# Patient Record
Sex: Male | Born: 1970 | Race: White | Hispanic: No | Marital: Married | Smoking: Never smoker
Health system: Southern US, Community
[De-identification: ages and names within clinical notes are randomized; demographics above are authoritative.]

## PROBLEM LIST (undated history)

## (undated) HISTORY — PX: TONSILLECTOMY AND ADENOIDECTOMY: SHX28

---

## 2001-07-05 ENCOUNTER — Encounter: Admission: RE | Admit: 2001-07-05 | Discharge: 2001-07-05 | Payer: Self-pay | Admitting: Sports Medicine

## 2001-09-01 ENCOUNTER — Encounter: Admission: RE | Admit: 2001-09-01 | Discharge: 2001-09-01 | Payer: Self-pay | Admitting: Sports Medicine

## 2001-12-08 ENCOUNTER — Encounter: Admission: RE | Admit: 2001-12-08 | Discharge: 2001-12-08 | Payer: Self-pay | Admitting: Sports Medicine

## 2001-12-08 ENCOUNTER — Encounter: Payer: Self-pay | Admitting: Family Medicine

## 2001-12-08 ENCOUNTER — Inpatient Hospital Stay (HOSPITAL_COMMUNITY): Admission: AD | Admit: 2001-12-08 | Discharge: 2001-12-11 | Payer: Self-pay | Admitting: Family Medicine

## 2001-12-13 ENCOUNTER — Encounter (HOSPITAL_BASED_OUTPATIENT_CLINIC_OR_DEPARTMENT_OTHER): Admission: RE | Admit: 2001-12-13 | Discharge: 2002-03-13 | Payer: Self-pay | Admitting: Family Medicine

## 2001-12-19 ENCOUNTER — Encounter: Admission: RE | Admit: 2001-12-19 | Discharge: 2001-12-19 | Payer: Self-pay | Admitting: Sports Medicine

## 2001-12-27 ENCOUNTER — Encounter: Admission: RE | Admit: 2001-12-27 | Discharge: 2001-12-27 | Payer: Self-pay | Admitting: Sports Medicine

## 2008-07-19 ENCOUNTER — Ambulatory Visit: Payer: Self-pay | Admitting: Sports Medicine

## 2008-07-19 DIAGNOSIS — J019 Acute sinusitis, unspecified: Secondary | ICD-10-CM | POA: Insufficient documentation

## 2008-07-19 DIAGNOSIS — R030 Elevated blood-pressure reading, without diagnosis of hypertension: Secondary | ICD-10-CM | POA: Insufficient documentation

## 2008-07-26 ENCOUNTER — Encounter: Payer: Self-pay | Admitting: Sports Medicine

## 2008-07-26 LAB — CONVERTED CEMR LAB
Cholesterol: 162 mg/dL (ref 0–200)
Glucose, Bld: 81 mg/dL (ref 70–99)
HDL: 40 mg/dL (ref >39)
LDL Cholesterol: 96 mg/dL (ref 0–99)
Total CHOL/HDL Ratio: 4.1
Triglycerides: 128 mg/dL (ref <150)
VLDL: 26 mg/dL (ref 0–40)

## 2008-08-01 ENCOUNTER — Encounter (INDEPENDENT_AMBULATORY_CARE_PROVIDER_SITE_OTHER): Payer: Self-pay | Admitting: *Deleted

## 2008-08-01 ENCOUNTER — Encounter: Payer: Self-pay | Admitting: Sports Medicine

## 2008-08-14 ENCOUNTER — Ambulatory Visit: Payer: Self-pay | Admitting: Sports Medicine

## 2008-08-14 DIAGNOSIS — J301 Allergic rhinitis due to pollen: Secondary | ICD-10-CM | POA: Insufficient documentation

## 2010-06-20 NOTE — H&P (Signed)
Philip Waters, Philip Waters                             ACCOUNT NO.:  [id number]   MEDICAL RECORD NO.:  [id number]                   PATIENT TYPE:  INP   LOCATION:  5705                                 FACILITY:  [facility]   PHYSICIAN:  Dalbert Mayotte, M.D.                 DATE OF BIRTH:  06-Jan-1971   DATE OF ADMISSION:  12/08/2001  DATE OF DISCHARGE:                                HISTORY & PHYSICAL   ADMISSION DIAGNOSIS:  Left foot cellulitis.   HISTORY OF PRESENT ILLNESS:  The patient is a 40 year old Caucasian  male  who presented with a painful infected left toe. He reported a two year  history of having  an ingrown toenail for which he has treated himself by  cutting out the ingrown toenail with toenail clippers. He said that back on  November 25, 2001, he tried to cut it out a little more aggressively and then  it started to get red on Saturday, December 03, 2001. It became progressively  worse, turning red and painful and swollen over Sunday and Monday. On  Tuesday he tried to go to work and was sent to the city clinic nurse, who  then [facility], given the  severity of  the wound. There he was  evaluated and placed on dicloxacillin and p.o.  Vicodin with Bacitracin ointment to the toe. Since then though, he said the  toe did not appear to be getting any better and he was  having  some  drainage from the toe, so he came in to see Dr. Darrick Penna at the [facility] for further evaluation.   REVIEW OF SYSTEMS:  He denied any fever or chills. He did report some flu  symptoms approximately three or four weeks ago but otherwise has felt well.  He did have purulent drainage from the toe. Otherwise the review of systems  was negative.   PAST MEDICAL HISTORY:  Basically not significant. He did have a history of  atopic dermatitis on the bottom of his foot which he has been treated, and  borderline hyperlipidemia. He is status post  adenoidectomy as a teen.   CURRENT MEDICATIONS:  None.   ALLERGIES:  None.   SOCIAL HISTORY:  He lives with his wife  and 89-month-old son in [place].  He works as a Futures trader. He is a nonsmoker.   FAMILY HISTORY:  His father has coronary artery disease and  is status post  CABG, but there is no history of diabetes, hypertension or cancer in the  family.   PHYSICAL EXAMINATION:  VITAL SIGNS:  Blood pressure 128/80, temperature  97.0, pulse 62m, respiratory rate 16.  GENERAL:  He is a well appearing Caucasian  male in no acute distress. He is  slightly obese.  HEENT:  Pupils equally round and reactive to  light and accomodation. TMs  clear bilaterally. Oropharynx is clear with moist mucous membranes.  NECK:  Supple without lymphadenopathy or thyromegaly.  HEART:  Regular rate and rhythm, no murmur or gallop.  CHEST:  Clear to auscultation and percussion bilaterally.  ABDOMEN:  Soft, slightly obese, nontender, nondistended.  EXTREMITIES:  His right extremity shows no cyanosis, clubbing or edema. The  left extremity is significant for the edema of his left foot, especially  over the dorsal aspect and extending into his left great toe.  SKIN:  He has a large area of erythema over the dorsal aspect of his left  great toe extending onto the mid foot dorsum of his foot. There is a large  weeping area approximately 2 x 1 cm in length at the base right over his  metatarsal phalangeal joint on the left foot. This is draining  serosanguinous fluid.   LABORATORY DATA:  A wound culture was obtained of this area. The bone was  unable to be probed secondary to the patient's pain. A CBC, BMP, sed rate  and blood culture are pending.   ASSESSMENT AND PLAN:  This is a 40 year old white male with no significant  past medical history, admitted  with cellulitis of his left foot.   Cellulitis. Clearly  he has also developed some form of abscess on the  dorsal aspect of the base of  his left great toe that is spontaneously  draining at the base of his toe. This is worrisome for possibly underlying  osteo in  his first MTP joint. We did obtain wound cultures. We will follow  up on the results of that. We will check a sed rate, CBC with differential,  blood cultures x 2 and start him on IV antibiotics. Given the possibility of  underlying abscess formation, we will cover for  both Staph and anaerobes  with IV Unasyn. We can narrow this once we get back culture results. We will  go ahead and obtain plain films of the left great toe to look for any signs  of underlying osteo and consider proceeding with a bone scan if this is  negative and our suspicion remains high.                                               Dalbert Mayotte, M.D.    AS/MEDQ  D:  12/08/2001  T:  12/08/2001  Job:  [id number]

## 2010-06-20 NOTE — Discharge Summary (Signed)
   NAMELAURIS, SERVISS                             ACCOUNT NO.:  [id number]   MEDICAL RECORD NO.:  [id number]                   PATIENT TYPE:  INP   LOCATION:  5705                                 FACILITY:  [facility]   PHYSICIAN:  Wayne A. Sheffield Slider, M.D.                 DATE OF BIRTH:  [birth year]   DATE OF ADMISSION:  12/08/2001  DATE OF DISCHARGE:  12/11/2001                                 DISCHARGE SUMMARY   DISCHARGE DIAGNOSES:  Left great toe cellulitis.   DISCHARGE MEDICATIONS:  1. Augmentin 875 mg b.i.d. for 12 days.  2. Cipro 500 mg b.i.d. for five days.  3. Vicodin one tablet q.6h. p.r.n. pain.   DISPOSITION AND FOLLOWUP:  The patient was discharged to home and scheduled  for outpatient hydrotherapy on his left great toe as well as instructed to  use his  orthopedic boot every time he puts weight on his foot. The patient  was instructed to call his primary care physician to set up a followup  appointment in two weeks.   HOSPITAL COURSE:  This 40 year old white male was admitted for a left great  toe cellulitis. An x-ray of his foot was obtained  which showed some  swelling but no osteomyelitis. The patient was given Vicodin for the pain  which was controlled throughout hospitalization as well as patient was  afebrile the entire hospital stay. On day of discharge, the patient had  completed four days of Unasyn IV as well as two days of Cipro by mouth. He  is discharged on Augmentin as well as Cipro to fully treat the infection. He  will also return to the [facility] foot clinic for hydrotherapy treatment  and was instructed on wound care for his toe. The patient will follow up  with his primary care physician.   LABORATORY DATA:  White blood count on admission was 12.6, white count on  discharge was 10.7, hemoglobin 13.8, hematocrit 41.9, platelets 362. Wound  culture showed few WBCs, predominantly PMNs but no organisms, and blood  cultures showed gram negative rods in one tube  which was the preliminary and  blood culture tube were negative to date.     Billey Gosling, M.D.                       Wayne A. Sheffield Slider, M.D.    AS/MEDQ  D:  12/29/2001  T:  12/30/2001  Job:  [id number]

## 2012-04-19 ENCOUNTER — Encounter: Payer: Self-pay | Admitting: *Deleted

## 2012-04-19 DIAGNOSIS — I10 Essential (primary) hypertension: Secondary | ICD-10-CM

## 2012-06-17 ENCOUNTER — Encounter: Payer: Self-pay | Admitting: Sports Medicine

## 2012-06-17 ENCOUNTER — Encounter (HOSPITAL_COMMUNITY): Payer: Self-pay

## 2016-10-20 ENCOUNTER — Other Ambulatory Visit: Payer: Self-pay | Admitting: Sports Medicine

## 2016-10-20 ENCOUNTER — Ambulatory Visit (INDEPENDENT_AMBULATORY_CARE_PROVIDER_SITE_OTHER): Payer: Managed Care, Other (non HMO) | Admitting: Family Medicine

## 2016-10-20 VITALS — BP 124/80 | Ht 70.0 in | Wt 270.0 lb

## 2016-10-20 DIAGNOSIS — M5410 Radiculopathy, site unspecified: Secondary | ICD-10-CM | POA: Diagnosis not present

## 2016-10-20 MED ORDER — GABAPENTIN 100 MG PO CAPS: ORAL_CAPSULE | ORAL | 2 refills | Status: DC

## 2016-10-20 NOTE — Patient Instructions (Signed)
Take by tablet by mouth Day 1-3:   take one at night  Day 4-6:  Take 2 at night Day 7 until I see you back  take 3 at night Great to see you!

## 2016-10-21 ENCOUNTER — Encounter: Payer: Self-pay | Admitting: Family Medicine

## 2016-10-21 NOTE — Progress Notes (Signed)
    CHIEF COMPLAINT / HPI:  Left low back pain radiating into his le all the way down to the foot.  This is been going on chronically fo  No specific inciting event.  Gradual onset and symptoms have been stable for the last  6-8 months.  He has some aching cramping and sensation but has had no falls or giving way of the left leg.  He gets some tingling and numbness occasionally but never totally loses sensation.  Pain is worse if he does a lot of heavy lifting.  Some days he has very little pain  REVIEW OF SYSTEMS:  No bowel or bladder incontinence.  He has had no unusual weight change.  No fever.  See HPI for additional pertinent review of systems  PERTINENT  PMH / PSH: I have reviewed the patient's medications, allergies, past medical and surgical history, smoking status and updated in the EMR as appropriate. History of elevated blood pressure with out diagnosis of hypertension Obesity Never smoker  OBJECTIVE: GENERAL: Well-developed, overweight male, no acute distress ABDOMEN: Soft, positive bowel sounds. BACK: Mild muscle spasm is noted in the mid thoracic paraspinal muscles on the left.  There is no defect noted.  He has some limited flexion at the hips but this is mostly related to hamstring tight.  He has normal hyperextension and ro  Straight leg raise is negative bilaterally  MSK normal muscle bulk and tone diffusely.  His flexion/extension strength is normal at hips and knee, dorsiflexion/plantarflexion normal and symmetrical as well. NEURO: DTRs 1-2+ bilaterally equal  At the knee and ankle  PSYCHIATRIC: Alert and oriented 4  ASSESSMENT / PLAN: #1.  Chronic low back pain, intermittently problematic with some radicular symptoms but no weakness.  We discussed options including further evaluation with imaging including MRI versus more conservative treatment options such as medication like gabapentin.  Ultimately he decided to try the gabapentin and follow-up with me in 4-6 weeks.  He  will call in the interim if he has any new or worsening symptoms.

## 2016-11-20 ENCOUNTER — Other Ambulatory Visit: Payer: Managed Care, Other (non HMO) | Admitting: Family Medicine

## 2017-05-14 ENCOUNTER — Ambulatory Visit (INDEPENDENT_AMBULATORY_CARE_PROVIDER_SITE_OTHER): Payer: Managed Care, Other (non HMO) | Admitting: Family Medicine

## 2017-05-14 ENCOUNTER — Encounter: Payer: Self-pay | Admitting: Family Medicine

## 2017-05-14 VITALS — BP 114/70 | Ht 70.0 in | Wt 238.0 lb

## 2017-05-14 DIAGNOSIS — R002 Palpitations: Secondary | ICD-10-CM | POA: Insufficient documentation

## 2017-05-14 DIAGNOSIS — R0789 Other chest pain: Secondary | ICD-10-CM

## 2017-05-14 NOTE — Assessment & Plan Note (Signed)
Long conversation with him.  I feel very certain that these are palpitations and are not pathologic.  He is quite concerned about it and does have family history.  At one point he was told he should have an exercise treadmill test when he was in his 21s which she is not done.  I will set him up with cardiology referral to address these issues.

## 2017-05-14 NOTE — Progress Notes (Signed)
  Philip Waters - 47 y.o. male MRN [id number]  Date of birth: May 31, 1970    SUBJECTIVE:      Chief Complaint:/ HPI:  Heart skipping beats.  Has noticed this mostly in the evening when he is watching television.  Does not occur when he is up and moving around as often.  No chest pain.  The irregular beats make him quite anxious that something is wrong with his heart.  His father had significant cardiovascular history.  No unusual weight change although he has been on a plant-based diet for the last 8-9 months and has lost 36 pounds, it has been a gradual and expected loss.  No lower extremity edema.  No shortness of breath with exertion.   ROS:     See HPI  PERTINENT  PMH / PSH FH / / SH:  Past Medical, Surgical, Social, and Family History Reviewed & Updated in the EMR.  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  OBJECTIVE: BP 114/70   Ht 5\' 10"  (1.778 m)   Wt 238 lb (108 kg)   BMI 34.15 kg/m   Physical Exam:  Vital signs are reviewed. Vital signs reviewed. GENERAL: Well-developed, well-nourished, no acute distress. CARDIOVASCULAR: Regular rate and rhythm no murmur gallop or rub LUNGS: Clear to auscultation bilaterally, no rales or wheeze. ABDOMEN: Soft positive bowel sounds NEURO: No gross focal neurological deficits. MSK: Movement of extremity x 4.  EKG: Normal sinus rhythm.  Interventricular conduction delay/incomplete right bundle branch block.  Normal intervals.  Reviewed his EKG from last year which he brings with him.  No changes.  He also brings in some outside lab work which reveals total cholesterol of 166, LDL 108 and HDL 35.  Triglycerides 160.  CMP and CBC normal.  Normal PSA ASSESSMENT & PLAN:  See problem based charting & AVS for pt instructions.

## 2017-05-14 NOTE — Patient Instructions (Signed)
Dr Peter Swaziland Wednesday May 22nd at 840a [address] [place], [place] [address] [phone]

## 2017-05-18 ENCOUNTER — Encounter: Payer: Self-pay | Admitting: Family Medicine

## 2017-05-18 ENCOUNTER — Encounter: Payer: Self-pay | Admitting: Adult Health

## 2017-05-18 ENCOUNTER — Ambulatory Visit: Payer: Managed Care, Other (non HMO) | Admitting: Adult Health

## 2017-05-18 VITALS — BP 120/86 | HR 88 | Ht 70.0 in | Wt 234.0 lb

## 2017-05-18 DIAGNOSIS — R0789 Other chest pain: Secondary | ICD-10-CM | POA: Diagnosis not present

## 2017-05-18 DIAGNOSIS — R002 Palpitations: Secondary | ICD-10-CM | POA: Diagnosis not present

## 2017-05-18 DIAGNOSIS — Z79899 Other long term (current) drug therapy: Secondary | ICD-10-CM | POA: Diagnosis not present

## 2017-05-18 NOTE — Progress Notes (Signed)
Cardiology Office Note   Date:  05/18/2017   ID:  Philip Waters, DOB 04-Jan-1971, MRN [id number]  PCP:  Nestor Ramp, MD  Cardiologist: New Chief Complaint  Patient presents with  . Chest Pain     History of Present Illness: Philip Waters is a 47 y.o. male who presents for cardiac evaluation at the request of Dr. Denny Levy, for patient complaints of chest discomfort and palpitations.  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  He has no prior cardiac history.  His father, Aria Pickrell, is a patient of Dr. Elvis Coil as well, who is followed for coronary artery disease with history of CABG.  The patient states that he has been having multiple palpitations throughout the day, feeling his heart skipping, sometimes racing, and it is begun to worry him.  He states he has no associated shortness of breath dizziness diaphoresis or near syncope.  He has had no changes in his exercise capacity or stamina.  Adenoidectomy   No past medical history on file.  Past Surgical History:  Procedure Laterality Date  . TONSILLECTOMY AND ADENOIDECTOMY       No current outpatient medications on file.   No current facility-administered medications for this visit.     Allergies:   Patient has no known allergies.    Social History:  The patient  reports that he has never smoked. He has never used smokeless tobacco.   Family History:  The patient's family history includes Healthy in his mother; Heart disease in his father, paternal grandfather, and paternal grandmother; Hernia in his brother; Hyperlipidemia in his father.    ROS: All other systems are reviewed and negative. Unless otherwise mentioned in H&P    PHYSICAL EXAM: VS:  BP 120/86 (BP Location: Right Arm)   Pulse 88   Ht 5\' 10"  (1.778 m)   Wt 234 lb (106.1 kg)   BMI 33.58 kg/m  , BMI  Body mass index is 33.58 kg/m. GEN: Well nourished, well developed, in no acute distress  HEENT: normal  Neck: no JVD, carotid bruits, or masses Cardiac: RRR; occasional extrasystole no murmurs, rubs, or gallops,no edema  Respiratory:  Clear to auscultation bilaterally, normal work of breathing GI: soft, nontender, nondistended, + BS MS: no deformity or atrophy  Skin: warm and dry, no rash Neuro:  Strength and sensation are intact Psych: euthymic mood, full affect   EKG: Normal sinus rhythm with occasional PVCs no acute ST-T wave abnormalities.  Recent Labs: No results found for requested labs within last 8760 hours.    Lipid Panel    Component Value Date/Time   CHOL 162 07/26/2008 0017   TRIG 128 07/26/2008 0017   HDL 40 07/26/2008 0017   CHOLHDL 4.1 Ratio 07/26/2008 0017   VLDL 26 07/26/2008 0017   LDLCALC 96 07/26/2008 0017      Wt Readings from Last 3 Encounters:  05/18/17 234 lb (106.1 kg)  05/14/17 238 lb (108 kg)  10/20/16 270 lb (122.5 kg)      Other studies Reviewed: No prior cardiac studies to review  ASSESSMENT AND PLAN:  1.  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  He is currently asymptomatic with them but it does cause some anxiety for him due to the frequency.  He states that he has slight chest pressure associated but he thinks is from worrying about them.  My plan is to put a 2-week cardiac monitor on him for evaluation of morphology and frequency of his palpitations.  EKG did reveal 2 PVCs.  I am also going to check an echocardiogram for structural heart disease.  I am going to check some labs to evaluate potassium and magnesium status, along with a TSH.  Due to family history I am going to do a GXT to evaluate patient's heart rate response to exercise along with blood pressure and for changes in EKG during exercise.   I have discussed this with the patient and with Dr. Swaziland on site.  Dr.  Swaziland has reviewed his chart and EKG and is in agreement with my assessment and plan.  2.  Obesity: He has on a strict plant-based diet, has lost 39 pounds since December 2018.  He is doing well concerning this and is feeling better each day with weight loss..  Current medicines are reviewed at length with the patient today.    Labs/ tests ordered today include: Echocardiogram, GXT, BMET, TSH.   Bettey Mare. Liborio Nixon, ANP, AACC   05/18/2017 8:31 AM    [facility] [address] [place], [place] [address] Phone: [phone]; Fax: [phone]

## 2017-05-18 NOTE — Patient Instructions (Signed)
Medication Instructions:   NO CHANGES If you need a refill on your cardiac medications before your next appointment, please call your pharmacy.  Labwork: BMET,LIPID,LFT,TSH,MAG TODAY HERE IN OUR OFFICE AT [facility]  Take the provided lab slips for you to take with you to the lab for you blood draw.   Testing/Procedures: Your physician has requested that you have an exercise tolerance test .A cardiac stress test is a cardiological test that measures the heart's ability to respond to external stress(exercise-treadmill) in a controlled clinical environment.  For further information please visit [email or web address] Please also follow instruction sheet, as given.  Your physician has recommended that you wear an event monitor. This will be performed at our [facility] location - [address].  Event monitors are medical devices that record the heart's electrical activity. Doctors most often [place] these monitors to diagnose arrhythmias. Arrhythmias are problems with the speed or rhythm of the heartbeat. The monitor is a small, portable device. You can wear one while you do your normal daily activities. This is usually used to diagnose what is causing palpitations/syncope (passing out).  Echocardiogram - Your physician has requested that you have an echocardiogram. Echocardiography is a painless test that uses sound waves to create images of your heart. It provides your doctor with information about the size and shape of your heart and how well your heart's chambers and valves are working. This procedure takes approximately one hour. There are no restrictions for this procedure. This will be performed at our [facility] location - [address].  Follow-Up: Your physician wants you to follow-up in: WITH JR Swaziland AFTER TESTING-ABOUT 3-4 WEEKS   Thank you for choosing [facility]

## 2017-05-19 LAB — LIPID PANEL
Chol/HDL Ratio: 4.4 ratio (ref 0.0–5.0)
Cholesterol, Total: 146 mg/dL (ref 100–199)
HDL: 33 mg/dL — ABNORMAL LOW (ref >39)
LDL Calculated: 91 mg/dL (ref 0–99)
Triglycerides: 110 mg/dL (ref 0–149)
VLDL Cholesterol Cal: 22 mg/dL (ref 5–40)

## 2017-05-19 LAB — HEPATIC FUNCTION PANEL
ALT: 38 IU/L (ref 0–44)
AST: 23 IU/L (ref 0–40)
Albumin: 4.9 g/dL (ref 3.5–5.5)
Alkaline Phosphatase: 109 IU/L (ref 39–117)
Bilirubin Total: 1.1 mg/dL (ref 0.0–1.2)
Bilirubin, Direct: 0.27 mg/dL (ref 0.00–0.40)
Total Protein: 7.7 g/dL (ref 6.0–8.5)

## 2017-05-19 LAB — BASIC METABOLIC PANEL
BUN/Creatinine Ratio: 15 (ref 9–20)
BUN: 13 mg/dL (ref 6–24)
CALCIUM: 10.1 mg/dL (ref 8.7–10.2)
CHLORIDE: 101 mmol/L (ref 96–106)
CO2: 22 mmol/L (ref 20–29)
Creatinine, Ser: 0.84 mg/dL (ref 0.76–1.27)
GFR, EST AFRICAN AMERICAN: 121 mL/min/{1.73_m2} (ref >59)
GFR, EST NON AFRICAN AMERICAN: 105 mL/min/{1.73_m2} (ref >59)
Glucose: 94 mg/dL (ref 65–99)
POTASSIUM: 4.2 mmol/L (ref 3.5–5.2)
SODIUM: 141 mmol/L (ref 134–144)

## 2017-05-19 LAB — TSH: TSH: 1.45 u[IU]/mL (ref 0.450–4.500)

## 2017-05-19 LAB — MAGNESIUM: MAGNESIUM: 2.3 mg/dL (ref 1.6–2.3)

## 2017-05-20 ENCOUNTER — Telehealth: Payer: Self-pay | Admitting: Cardiology

## 2017-05-20 ENCOUNTER — Telehealth (HOSPITAL_COMMUNITY): Payer: Self-pay

## 2017-05-20 NOTE — Telephone Encounter (Signed)
Encounter complete. 

## 2017-05-20 NOTE — Addendum Note (Signed)
Addended by: Armen Pickup T on: 05/20/2017 03:00 PM   Modules accepted: Orders

## 2017-05-20 NOTE — Telephone Encounter (Signed)
Spoke with pt, aware of lab results. Copy mailed to patient at his request. 

## 2017-05-20 NOTE — Telephone Encounter (Signed)
New Message ° ° °Patient is returning call in reference to labs. Please call to discuss.  °

## 2017-05-21 NOTE — Addendum Note (Signed)
Addended by: Armen Pickup T on: 05/21/2017 07:09 AM   Modules accepted: Orders

## 2017-05-25 ENCOUNTER — Ambulatory Visit (HOSPITAL_COMMUNITY)
Admission: RE | Admit: 2017-05-25 | Discharge: 2017-05-25 | Disposition: A | Discharge status: Home / Self Care | Payer: Managed Care, Other (non HMO) | Source: Ambulatory Visit | Attending: Cardiology | Admitting: Cardiology

## 2017-05-25 DIAGNOSIS — R0789 Other chest pain: Secondary | ICD-10-CM

## 2017-05-25 DIAGNOSIS — R002 Palpitations: Secondary | ICD-10-CM | POA: Diagnosis not present

## 2017-05-25 LAB — EXERCISE TOLERANCE TEST
Estimated workload: 13.7 METS
Exercise duration (min): 12 min
Exercise duration (sec): 0 s
MPHR: 174 {beats}/min
Peak HR: 190 {beats}/min
Percent HR: 109 %
RPE: 18
Rest HR: 85 {beats}/min

## 2017-06-02 ENCOUNTER — Encounter: Payer: Self-pay | Admitting: Cardiology

## 2017-06-02 ENCOUNTER — Ambulatory Visit (INDEPENDENT_AMBULATORY_CARE_PROVIDER_SITE_OTHER): Payer: Managed Care, Other (non HMO)

## 2017-06-02 ENCOUNTER — Other Ambulatory Visit: Payer: Self-pay

## 2017-06-02 ENCOUNTER — Ambulatory Visit (HOSPITAL_COMMUNITY): Payer: Managed Care, Other (non HMO) | Attending: Cardiology

## 2017-06-02 DIAGNOSIS — R0789 Other chest pain: Secondary | ICD-10-CM | POA: Insufficient documentation

## 2017-06-02 DIAGNOSIS — R002 Palpitations: Secondary | ICD-10-CM

## 2017-06-03 ENCOUNTER — Encounter: Payer: Self-pay | Admitting: Cardiology

## 2017-06-21 NOTE — Progress Notes (Signed)
Cardiology Office Note   Date:  06/23/2017   ID:  Philip Waters, DOB [birth year], MRN [id number]  PCP:  Philip Ramp, MD  Cardiologist: Kealohilani Maiorino Sw[place] MD Chief Complaint  Patient presents with  . Palpitations     History of Present Illness: Philip Waters is a 47 y.o. male who is seen for follow up  of  palpitations.  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  He has no prior cardiac history.  His father, Philip Waters, is a patient of mine as well, who is followed for coronary artery disease with history of CABG.  The patient states that beginning in March he has been having multiple palpitations throughout the day, feeling his heart skipping.  He states he has no associated shortness of breath, dizziness, diaphoresis or near syncope. No chest pain.  He has had no changes in his exercise capacity or stamina.  Admits he does not exercise much.   History reviewed. No pertinent past medical history.  Past Surgical History:  Procedure Laterality Date  . TONSILLECTOMY AND ADENOIDECTOMY       Current Outpatient Medications  Medication Sig Dispense Refill  . metoprolol succinate (TOPROL-XL) 50 MG 24 hr tablet Take 1 tablet (50 mg total) by mouth daily. Take with or immediately following a meal. 90 tablet 3   No current facility-administered medications for this visit.     Allergies:   Patient has no known allergies.    Social History:  The patient  reports that he has never smoked. He has never used smokeless tobacco. He reports that he does not drink alcohol or use drugs.   Family History:  The patient's family history includes Healthy in his mother; Heart disease in his father, paternal grandfather, and paternal grandmother; Hernia in his brother; Hyperlipidemia in his father.    ROS: All other systems are reviewed and negative.  Unless otherwise mentioned in H&P    PHYSICAL EXAM: VS:  BP 128/83   Pulse 83   Ht 5\' 10"  (1.778 m)   Wt 228 lb 9.6 oz (103.7 kg)   SpO2 99%   BMI 32.80 kg/m  , BMI Body mass index is 32.8 kg/m. GENERAL:  Well appearing HEENT:  PERRL, EOMI, sclera are clear. Oropharynx is clear. NECK:  No jugular venous distention, carotid upstroke brisk and symmetric, no bruits, no thyromegaly or adenopathy LUNGS:  Clear to auscultation bilaterally CHEST:  Unremarkable HEART:  RRR,  PMI not displaced or sustained,S1 and S2 within normal limits, no S3, no S4: no clicks, no rubs, no murmurs ABD:  Soft, nontender. BS +, no masses or bruits. No hepatomegaly, no splenomegaly EXT:  2 + pulses throughout, no edema, no cyanosis no clubbing SKIN:  Warm and dry.  No rashes NEURO:  Alert and oriented x 3. Cranial nerves II through XII intact. PSYCH:  Cognitively intact     EKG: Not done today.  Recent Labs: 05/18/2017: ALT 38; BUN 13; Creatinine, Ser 0.84; Magnesium 2.3; Potassium 4.2; Sodium 141; TSH 1.450    Lipid Panel    Component Value Date/Time   CHOL 146 05/18/2017 0857   TRIG 110 05/18/2017 0857   HDL 33 (L) 05/18/2017 0857   CHOLHDL 4.4 05/18/2017 0857   CHOLHDL 4.1 Ratio 07/26/2008 0017   VLDL 26 07/26/2008 0017  LDLCALC 91 05/18/2017 0857      Wt Readings from Last 3 Encounters:  06/23/17 228 lb 9.6 oz (103.7 kg)  05/18/17 234 lb (106.1 kg)  05/14/17 238 lb (108 kg)      Other studies Reviewed: ETT 05/25/17: Study Highlights     Blood pressure demonstrated a normal response to exercise.  There was no ST segment deviation noted during stress.   ETT with excellent exercise tolerance (12:00); no chest pain; normal BP response; no ST changes; negative adequate ETT; Duke treadmill score 12.   Echo 06/02/17: Study Conclusions  - Left ventricle: The cavity size was normal. Wall thickness was   normal. Systolic function was low normal to mildly reduced. The   estimated  ejection fraction was in the range of 50% to 55%. Wall   motion was normal; there were no regional wall motion   abnormalities. Indeterminant diastolic function. - Aortic valve: There was no stenosis. - Mitral valve: There was no regurgitation. - Right ventricle: The cavity size was mildly dilated. Systolic   function was mildly reduced. - Pulmonary arteries: No complete TR doppler jet so unable to   estimate PA systolic pressure. - Inferior vena cava: The vessel was normal in size. The   respirophasic diameter changes were in the normal range (>= 50%),   consistent with normal central venous pressure.  Impressions:  - Normal LV size with low normal to mildly reduced systolic   function, EF 50-55%. No discrete wall motion abnormalities.   Mildly dilated RV with mildly decreased systolic function.   Event monitor 06/02/17: Frequent isolated PVCs.   ASSESSMENT AND PLAN:  1.  PVCs with associated palpitations. Normal LV function by Echo. No evidence of ischemia on ETT and no increase in ectopy with exercise. My concern is with the RV enlargement noted on Echo and whether this could represent ARVC . Will arrange for MI. He states he is having enough symptoms he would like to try medication to suppress PVCs. Will add Toprol XL 50 mg daily. Follow up in 3 months. No restrictions.  2.  Obesity: He has on a strict plant-based diet, has lost 46 pounds since December 2018.  He is doing well concerning this and is feeling better each day with weight loss. Encourage adding aerobic activity.    Philip KitchenPeter Swaziland MD, [facility]

## 2017-06-23 ENCOUNTER — Other Ambulatory Visit: Payer: Self-pay

## 2017-06-23 ENCOUNTER — Encounter: Payer: Self-pay | Admitting: Cardiology

## 2017-06-23 ENCOUNTER — Ambulatory Visit: Payer: Managed Care, Other (non HMO) | Admitting: Cardiology

## 2017-06-23 VITALS — BP 128/83 | HR 83 | Ht 70.0 in | Wt 228.6 lb

## 2017-06-23 DIAGNOSIS — I493 Ventricular premature depolarization: Secondary | ICD-10-CM | POA: Insufficient documentation

## 2017-06-23 DIAGNOSIS — I428 Other cardiomyopathies: Secondary | ICD-10-CM

## 2017-06-23 DIAGNOSIS — R002 Palpitations: Secondary | ICD-10-CM

## 2017-06-23 DIAGNOSIS — I517 Cardiomegaly: Secondary | ICD-10-CM | POA: Diagnosis not present

## 2017-06-23 MED ORDER — METOPROLOL SUCCINATE ER 50 MG PO TB24: 50.0000 mg | ORAL_TABLET | Freq: Every day | ORAL | 3 refills | Status: DC

## 2017-06-23 NOTE — Patient Instructions (Signed)
We will add Toprol XL 50 mg daily   We will schedule you for a cardiac MRI

## 2017-06-23 NOTE — Progress Notes (Signed)
cardiac

## 2017-07-03 ENCOUNTER — Encounter: Payer: Self-pay | Admitting: Cardiology

## 2017-07-07 ENCOUNTER — Ambulatory Visit (HOSPITAL_COMMUNITY)
Admission: RE | Admit: 2017-07-07 | Discharge: 2017-07-07 | Disposition: A | Discharge status: Home / Self Care | Payer: Managed Care, Other (non HMO) | Source: Ambulatory Visit | Attending: Cardiology | Admitting: Cardiology

## 2017-07-07 DIAGNOSIS — I493 Ventricular premature depolarization: Secondary | ICD-10-CM

## 2017-07-07 DIAGNOSIS — I428 Other cardiomyopathies: Secondary | ICD-10-CM | POA: Diagnosis not present

## 2017-07-07 DIAGNOSIS — R9439 Abnormal result of other cardiovascular function study: Secondary | ICD-10-CM | POA: Diagnosis not present

## 2017-07-07 LAB — CREATININE, SERUM
CREATININE: 0.9 mg/dL (ref 0.61–1.24)
GFR calc non Af Amer: >60 mL/min (ref >60)

## 2017-07-07 MED ORDER — GADOBENATE DIMEGLUMINE 529 MG/ML IV SOLN
35.0000 mL | Freq: Once | INTRAVENOUS | Status: AC
  Administered 2017-07-07: 35 mL via INTRAVENOUS

## 2017-10-09 NOTE — Progress Notes (Signed)
Cardiology Office Note   Date:  10/13/2017   ID:  Philip Waters, DOB 09/05/1970, MRN [id number]  PCP:  Nestor Ramp, MD  Cardiologist: Peter Swaziland MD Chief Complaint  Patient presents with  . Follow-up    3 months  . Palpitations     History of Present Illness: Cabot Cromartie is a 47 y.o. male who is seen for follow up  of  palpitations.  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  He has no prior cardiac history.  His father, Ona Rathert, is a patient of mine as well, who is followed for coronary artery disease with history of CABG.  The patient states that beginning in March he was having multiple palpitations throughout the day, feeling his heart skipping.  He states he has no associated shortness of breath, dizziness, diaphoresis or near syncope. No chest pain.  We started him on Toprol and his symptoms have improved. Still feels palpitations some at night but not much during the day and it doesn't affect his sleep or energy. He did get married last month.   History reviewed. No pertinent past medical history.  Past Surgical History:  Procedure Laterality Date  . TONSILLECTOMY AND ADENOIDECTOMY       Current Outpatient Medications  Medication Sig Dispense Refill  . metoprolol succinate (TOPROL-XL) 50 MG 24 hr tablet Take 1 tablet (50 mg total) by mouth daily. Take with or immediately following a meal. 90 tablet 3   No current facility-administered medications for this visit.     Allergies:   Patient has no known allergies.    Social History:  The patient  reports that he has never smoked. He has never used smokeless tobacco. He reports that he does not drink alcohol or use drugs.   Family History:  The patient's family history includes Healthy in his mother; Heart disease in his father, paternal grandfather, and paternal  grandmother; Hernia in his brother; Hyperlipidemia in his father.    ROS: All other systems are reviewed and negative. Unless otherwise mentioned in H&P    PHYSICAL EXAM: VS:  BP 108/72 (BP Location: Left Arm, Patient Position: Sitting, Cuff Size: Normal)   Pulse (!) 55   Ht 5\' 10"  (1.778 m)   Wt 235 lb (106.6 kg)   BMI 33.72 kg/m  , BMI Body mass index is 33.72 kg/m. GENERAL:  Well appearing WM in NAD HEENT:  PERRL, EOMI, sclera are clear. Oropharynx is clear. NECK:  No jugular venous distention, carotid upstroke brisk and symmetric, no bruits, no thyromegaly or adenopathy LUNGS:  Clear to auscultation bilaterally CHEST:  Unremarkable HEART:  RRR,  PMI not displaced or sustained,S1 and S2 within normal limits, no S3, no S4: no clicks, no rubs, no murmurs ABD:  Soft, nontender. BS +, no masses or bruits. No hepatomegaly, no splenomegaly EXT:  2 + pulses throughout, no edema, no cyanosis no clubbing SKIN:  Warm and dry.  No rashes NEURO:  Alert and oriented x 3. Cranial nerves II through XII intact. PSYCH:  Cognitively intact       EKG: Not done today.  Recent Labs: 05/18/2017: ALT 38; BUN 13; Magnesium 2.3; Potassium 4.2; Sodium 141; TSH 1.450 07/07/2017: Creatinine, Ser 0.90    Lipid Panel    Component Value Date/Time   CHOL 146  05/18/2017 0857   TRIG 110 05/18/2017 0857   HDL 33 (L) 05/18/2017 0857   CHOLHDL 4.4 05/18/2017 0857   CHOLHDL 4.1 Ratio 07/26/2008 0017   VLDL 26 07/26/2008 0017   LDLCALC 91 05/18/2017 0857      Wt Readings from Last 3 Encounters:  10/13/17 235 lb (106.6 kg)  06/23/17 228 lb 9.6 oz (103.7 kg)  05/18/17 234 lb (106.1 kg)      Other studies Reviewed: ETT 05/25/17: Study Highlights     Blood pressure demonstrated a normal response to exercise.  There was no ST segment deviation noted during stress.   ETT with excellent exercise tolerance (12:00); no chest pain; normal BP response; no ST changes; negative adequate ETT; Duke  treadmill score 12.   Echo 06/02/17: Study Conclusions  - Left ventricle: The cavity size was normal. Wall thickness was   normal. Systolic function was low normal to mildly reduced. The   estimated ejection fraction was in the range of 50% to 55%. Wall   motion was normal; there were no regional wall motion   abnormalities. Indeterminant diastolic function. - Aortic valve: There was no stenosis. - Mitral valve: There was no regurgitation. - Right ventricle: The cavity size was mildly dilated. Systolic   function was mildly reduced. - Pulmonary arteries: No complete TR doppler jet so unable to   estimate PA systolic pressure. - Inferior vena cava: The vessel was normal in size. The   respirophasic diameter changes were in the normal range (>= 50%),   consistent with normal central venous pressure.  Impressions:  - Normal LV size with low normal to mildly reduced systolic   function, EF 50-55%. No discrete wall motion abnormalities.   Mildly dilated RV with mildly decreased systolic function.   Event monitor 06/02/17: Frequent isolated PVCs.   Cardiac MRI 07/07/17: IMPRESSION: 1. Normal left ventricular size with basal septal hypertrophy and low normal systolic function (LVEF = 55%). There are no regional wall motion abnormalities.  There is no late gadolinium enhancement in the left ventricular myocardium.  2. Normal right ventricular size when calculated for BSA and normal systolic function (LVEF = 48%). There are no regional wall motion abnormalities, no right ventricular dyskinesis or aneurysms were seen.  There is no evidence for infiltrative, inflammatory cardiomyopathy, neither for arrhythmogenic right ventricular cardiomyopathy.  ASSESSMENT AND PLAN:  1.  PVCs with associated palpitations. Extensive cardiac evaluation is negative.  Normal LV function by Echo. No evidence of ischemia on ETT and no increase in ectopy with exercise. MRI normal without evidence of LV  or RV dysfunction. Symptoms improved onToprol XL 50 mg daily. Follow up in one year.  No restrictions.  2.  Obesity: He has on a strict plant-based diet. He is active.   Continue lifestyle modification.    Marland KitchenPeter Swaziland MD, [facility]

## 2017-10-13 ENCOUNTER — Encounter: Payer: Self-pay | Admitting: Cardiology

## 2017-10-13 ENCOUNTER — Ambulatory Visit: Payer: Managed Care, Other (non HMO) | Admitting: Cardiology

## 2017-10-13 VITALS — BP 108/72 | HR 55 | Ht 70.0 in | Wt 235.0 lb

## 2017-10-13 DIAGNOSIS — I493 Ventricular premature depolarization: Secondary | ICD-10-CM | POA: Diagnosis not present

## 2017-10-13 NOTE — Patient Instructions (Signed)
Continue your current therapy  I will see you in a year 

## 2018-04-26 ENCOUNTER — Other Ambulatory Visit: Payer: Self-pay | Admitting: *Deleted

## 2018-04-26 MED ORDER — METOPROLOL SUCCINATE ER 50 MG PO TB24: 50.0000 mg | ORAL_TABLET | Freq: Every day | ORAL | 0 refills | Status: DC

## 2018-07-22 ENCOUNTER — Other Ambulatory Visit: Payer: Self-pay | Admitting: Cardiology

## 2018-10-14 IMAGING — MR MR CARD MORPHOLOGY WO/W CM
14 of 16 series · 14 of 16 positions shown · IV contrast (multihance)
Comparison: none

CLINICAL DATA: 46-year-old male with h/o palpitations, frequent
PVCs and mild right ventricular dysfunction on echocardiogram.
Evaluate for ARVC.

EXAM:
CARDIAC MRI
TECHNIQUE: The patient was scanned on a 1.5 Tesla GE magnet. A dedicated
cardiac coil was used. Functional imaging was done using Fiesta
sequences. [DATE], and 4 chamber views were done to assess for RWMA's.
Modified Jaylene rule using a short axis stack was used to
calculate an ejection fraction on a dedicated work station using
Circle software. The patient received 27 cc of Multihance. After 10
minutes inversion recovery sequences were used to assess for
infiltration and scar tissue.
MEDICATIONS:
27 cc  of Multihance

[Series 6: bSSFP · oblique · 8.0mm · 1.61mm/px · 1 of 425 slices shown (1 of 5)]
[im 1/425]
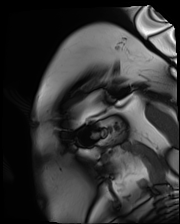

[Series 7: bSSFP · axial · 6.0mm · 1.41mm/px · 1 of 25 slices shown (2 of 5)]
[im 1/25]
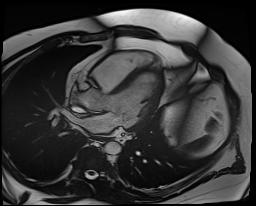

[Series 8: bSSFP · axial · 6.0mm · 1.41mm/px · 1 of 25 slices shown (3 of 5)]
[im 1/25]
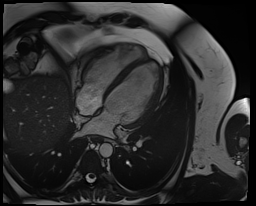

[Series 9: bSSFP · oblique · 6.0mm · 1.41mm/px · 1 of 25 slices shown (4 of 5)]
[im 1/25]
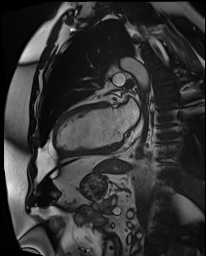

[Series 10: t1_tse_db axial · axial · 6.0mm · 1.32mm/px · 1 of 15 slices shown]
[im 1/15]
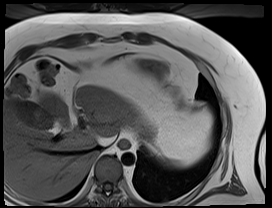

[Series 11: t2_stir_db axial · axial · 6.0mm · 1.73mm/px · 1 of 15 slices shown]
[im 1/15]
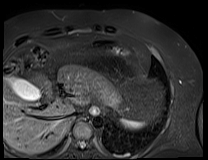

[Series 13: t1_tse_db sag · sagittal · 5.0mm · 1.32mm/px · 1 of 18 slices shown]
[im 1/18]
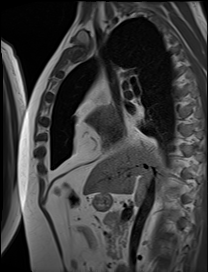

[Series 14: bSSFP · axial · 6.0mm · 1.61mm/px · 1 of 375 slices shown (5 of 5)]
[im 1/375]
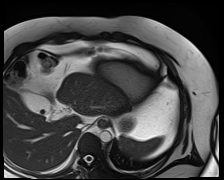

[Series 18: lge short axis_mag · oblique · 8.0mm · 1.50mm/px · 1 of 17 slices shown]
[im 1/17]
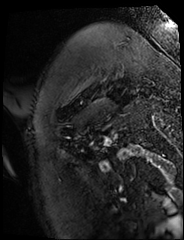

[Series 19: lge short axis_psir · oblique · 8.0mm · 1.50mm/px · 1 of 17 slices shown]
[im 1/17]
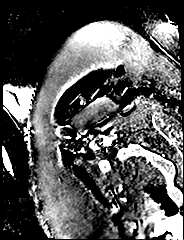

[Series 20: lge radial ((date)ch)_mag · axial · 6.0mm · 1.32mm/px · 1 of 1 slices shown (1 of 2)]
[im 1/1]
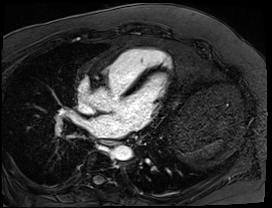

[Series 21: lge radial ((date)ch)_psir · axial · 6.0mm · 1.32mm/px · 1 of 1 slices shown (1 of 2)]
[im 1/1]
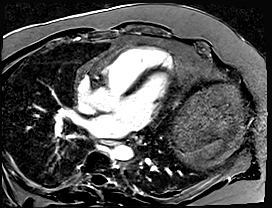

[Series 22: lge radial ((date)ch)_mag · axial · 6.0mm · 1.32mm/px · 1 of 1 slices shown (2 of 2)]
[im 1/1]
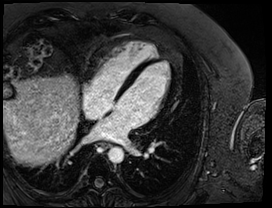

[Series 23: lge radial ((date)ch)_psir · axial · 6.0mm · 1.32mm/px · 1 of 1 slices shown (2 of 2)]
[im 1/1]
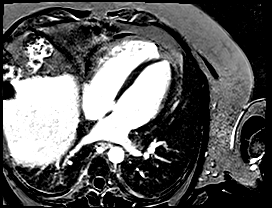

[14 of 16 positions shown; findings below may reference images not displayed]

FINDINGS: 1. Normal left ventricular size with basal septal hypertrophy and
low normal systolic function (LVEF = 55%). There are no regional
wall motion abnormalities.

There is no late gadolinium enhancement in the left ventricular
myocardium.

LVEDD: 53 mm

LVESD: 40 mm

LVEDV: 139 ml

LVESV: 63 ml

SV: 76 ml

CO: 5.7  L/min

Myocardial mass: 113 g

2. Normal right ventricular size, thickness and systolic function
(LVEF = 48%). There are no regional wall motion abnormalities.

RVEDV: 147 ml = 61 ml/m2 (normal < 95 ml/m2)

RVESV: 76 ml

SV: 71 ml

3.  Normal left and right atrial size.

4. Normal size of the aortic root, ascending aorta and pulmonary
artery.

5.  Mild tricuspid regurgitation.

6.  Normal pericardium.  No pericardial effusion.
IMPRESSION: 1. Normal left ventricular size with basal septal hypertrophy and
low normal systolic function (LVEF = 55%). There are no regional
wall motion abnormalities.

There is no late gadolinium enhancement in the left ventricular
myocardium.

2. Normal right ventricular size when calculated for BSA and normal
systolic function (LVEF = 48%). There are no regional wall motion
abnormalities, no right ventricular dyskinesis or aneurysms were
seen.

There is no evidence for infiltrative, inflammatory cardiomyopathy,
neither for arrhythmogenic right ventricular cardiomyopathy.

## 2018-11-23 ENCOUNTER — Other Ambulatory Visit: Payer: Self-pay | Admitting: Cardiology

## 2018-12-06 NOTE — Progress Notes (Signed)
Virtual Visit via Telephone Note   This visit type was conducted due to national recommendations for restrictions regarding the COVID-19 Pandemic (e.g. social distancing) in an effort to limit this patient's exposure and mitigate transmission in our community.  Due to his co-morbid illnesses, this patient is at least at moderate risk for complications without adequate follow up.  This format is felt to be most appropriate for this patient at this time.  The patient did not have access to video technology/had technical difficulties with video requiring transitioning to audio format only (telephone).  All issues noted in this document were discussed and addressed.  No physical exam could be performed with this format.  Please refer to the patient's chart for his  consent to telehealth for [facility].   Date:  12/09/2018   ID:  Philip Waters, DOB Feb 24, 1970, MRN [id number]  Patient Location: Home Provider Location: Home  PCP:  Nestor Ramp, MD  Cardiologist:  Aydien Majette Sw[place]il[place] MD Electrophysiologist:  None   Evaluation Performed:  Follow-Up Visit  Chief Complaint:  PVCs  History of Present Illness:    Philip Waters is a 48 y.o. male with history of PVCs and palpitations.  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  He has no prior cardiac history.  His father, Philip Waters, is a patient of mine as well, who is followed for coronary artery disease with history of CABG. He was seen in 2019 with complaints of palpitations. Holter demonstrated PVCs. ETT, Echo and cardiac MRI were OK. Symptoms improved on Toprol XL.  On follow up today he still notes PVCs occasionally. May be triggered by stress or hunger. No dizziness, chest pain or dyspnea. Plans to work four more years for the [employer]. Has a son applying to the fire department and a daughter who is a Printmaker in HS.   The patient does not have symptoms  concerning for COVID-19 infection (fever, chills, cough, or new shortness of breath).    No past medical history on file. Past Surgical History:  Procedure Laterality Date  . TONSILLECTOMY AND ADENOIDECTOMY       Current Meds  Medication Sig  . metoprolol succinate (TOPROL-XL) 50 MG 24 hr tablet TAKE 1 TABLET (50 MG TOTAL) BY MOUTH DAILY. TAKE WITH OR IMMEDIATELY FOLLOWING A MEAL.     Allergies:   Patient has no known allergies.   Social History   Tobacco Use  . Smoking status: Never Smoker  . Smokeless tobacco: Never Used  Substance Use Topics  . Alcohol use: Never    Frequency: Never  . Drug use: Never     Family Hx: The patient's family history includes Healthy in his mother; Heart disease in his father, paternal grandfather, and paternal grandmother; Hernia in his brother; Hyperlipidemia in his father.  ROS:   Please see the history of present illness.    All other systems reviewed and are negative.   Prior CV studies:   The following studies were reviewed today:  ETT 05/25/17: Study Highlights     Blood pressure demonstrated a normal response to exercise.  There was no ST segment deviation noted during stress.  ETT with excellent exercise tolerance (12:00); no chest pain; normal BP response; no ST changes; negative adequate ETT; Duke treadmill score 12.   Echo 06/02/17: Study Conclusions  - Left ventricle: The cavity size was normal. Wall  thickness was normal. Systolic function was low normal to mildly reduced. The estimated ejection fraction was in the range of 50% to 55%. Wall motion was normal; there were no regional wall motion abnormalities. Indeterminant diastolic function. - Aortic valve: There was no stenosis. - Mitral valve: There was no regurgitation. - Right ventricle: The cavity size was mildly dilated. Systolic function was mildly reduced. - Pulmonary arteries: No complete TR doppler jet so unable to estimate PA systolic  pressure. - Inferior vena cava: The vessel was normal in size. The respirophasic diameter changes were in the normal range (>= 50%), consistent with normal central venous pressure.  Impressions:  - Normal LV size with low normal to mildly reduced systolic function, EF 70-96%. No discrete wall motion abnormalities. Mildly dilated RV with mildly decreased systolic function.   Event monitor 06/02/17: Frequent isolated PVCs.   Cardiac MRI 07/07/17: IMPRESSION: 1. Normal left ventricular size with basal septal hypertrophy and low normal systolic function (LVEF = 55%). There are no regional wall motion abnormalities.  There is no late gadolinium enhancement in the left ventricular myocardium.  2. Normal right ventricular size when calculated for BSA and normal systolic function (LVEF = 48%). There are no regional wall motion abnormalities, no right ventricular dyskinesis or aneurysms were seen.  There is no evidence for infiltrative, inflammatory cardiomyopathy, neither for arrhythmogenic right ventricular cardiomyopathy.    Labs/Other Tests and Data Reviewed:    EKG:  No ECG reviewed.  Recent Labs: No results found for requested labs within last 8760 hours.   Recent Lipid Panel Lab Results  Component Value Date/Time   CHOL 146 05/18/2017 08:57 AM   TRIG 110 05/18/2017 08:57 AM   HDL 33 (L) 05/18/2017 08:57 AM   CHOLHDL 4.4 05/18/2017 08:57 AM   CHOLHDL 4.1 Ratio 07/26/2008 12:17 AM   LDLCALC 91 05/18/2017 08:57 AM    Wt Readings from Last 3 Encounters:  12/09/18 242 lb (109.8 kg)  10/13/17 235 lb (106.6 kg)  06/23/17 228 lb 9.6 oz (103.7 kg)     Objective:    Vital Signs:  BP 120/77   Pulse 72   Ht 5\' 10"  (1.778 m)   Wt 242 lb (109.8 kg)   BMI 34.72 kg/m    VITAL SIGNS:  reviewed  ASSESSMENT & PLAN:    1.  PVCs with associated palpitations. Extensive cardiac evaluation is negative.  Normal LV function by Echo. No evidence of ischemia on ETT  and no increase in ectopy with exercise. MRI normal without evidence of LV or RV dysfunction. Symptoms improved onToprol XL 50 mg daily. Will continue.  2.  Obesity: He is still on a  plant-based diet. He is active.   Continue lifestyle modification.  COVID-19 Education: The signs and symptoms of COVID-19 were discussed with the patient and how to seek care for testing (follow up with PCP or arrange E-visit).  The importance of social distancing was discussed today.  Time:   Today, I have spent 7 minutes with the patient with telehealth technology discussing the above problems.     Medication Adjustments/Labs and Tests Ordered: Current medicines are reviewed at length with the patient today.  Concerns regarding medicines are outlined above.   Tests Ordered: No orders of the defined types were placed in this encounter.   Medication Changes: No orders of the defined types were placed in this encounter.   Follow Up:  In Person in 1 year(s)  Signed, Almas Rake Martinique, MD  12/09/2018 8:07 AM  [facility]

## 2018-12-09 ENCOUNTER — Telehealth (INDEPENDENT_AMBULATORY_CARE_PROVIDER_SITE_OTHER): Payer: Managed Care, Other (non HMO) | Admitting: Cardiology

## 2018-12-09 ENCOUNTER — Encounter: Payer: Self-pay | Admitting: Cardiology

## 2018-12-09 VITALS — BP 120/77 | HR 72 | Ht 70.0 in | Wt 242.0 lb

## 2018-12-09 DIAGNOSIS — I493 Ventricular premature depolarization: Secondary | ICD-10-CM

## 2018-12-09 DIAGNOSIS — R002 Palpitations: Secondary | ICD-10-CM

## 2018-12-09 MED ORDER — METOPROLOL SUCCINATE ER 50 MG PO TB24: 50.0000 mg | ORAL_TABLET | Freq: Every day | ORAL | 3 refills | Status: DC

## 2018-12-09 NOTE — Patient Instructions (Signed)
Medication Instructions:  Continue same medications *If you need a refill on your cardiac medications before your next appointment, please call your pharmacy*  Lab Work: None ordered   Testing/Procedures: None ordered  Follow-Up: At [facility], you and your health needs are our priority.  As part of our continuing mission to provide you with exceptional heart care, we have created designated Provider Care Teams.  These Care Teams include your primary Cardiologist (physician) and Advanced Practice Providers (APPs -  Physician Assistants and Nurse Practitioners) who all work together to provide you with the care you need, when you need it.  Your next appointment:  1 year   Call in August to schedule November appointment    The format for your next appointment:  Office   Provider:  Dr.Jordan     

## 2020-02-22 ENCOUNTER — Other Ambulatory Visit: Payer: Self-pay | Admitting: Cardiology

## 2020-02-23 ENCOUNTER — Other Ambulatory Visit: Payer: Self-pay | Admitting: Cardiology

## 2020-03-18 ENCOUNTER — Other Ambulatory Visit: Payer: Self-pay | Admitting: Cardiology

## 2020-04-12 ENCOUNTER — Other Ambulatory Visit: Payer: Self-pay | Admitting: Cardiology

## 2020-05-07 ENCOUNTER — Other Ambulatory Visit: Payer: Self-pay | Admitting: Cardiology

## 2020-06-01 ENCOUNTER — Other Ambulatory Visit: Payer: Self-pay | Admitting: Cardiology

## 2020-07-04 ENCOUNTER — Other Ambulatory Visit: Payer: Self-pay | Admitting: Cardiology

## 2020-07-12 NOTE — Progress Notes (Signed)
Cardiology Office Note   Date:  07/23/2020   ID:  Philip Waters, DOB 1970-06-05, MRN [id number]  PCP:  Philip Ramp, MD  Cardiologist: Philip Zundel [place] MD Chief Complaint  Patient presents with   Palpitations      History of Present Illness: Philip Waters is a 50 y.o. male who is seen for follow up  of  palpitations.  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 Since last visit has gained 34 lbs. He has rare palpitations. Needs refill of Toprol. He has no prior cardiac history.  His father, Philip Waters, is a patient of mine as well, who is followed for coronary artery disease with history of CABG.  He was seen in 2019 with complaints of palpitations. Holter demonstrated PVCs. ETT, Echo and cardiac MRI were OK. Symptoms improved on Toprol XL.   Plans to work four more years for the [employer]. Has a son who is a IT sales professional in [employer] and a daughter who is a Holiday representative in [employer].     History reviewed. No pertinent past medical history.  Past Surgical History:  Procedure Laterality Date   TONSILLECTOMY AND ADENOIDECTOMY       Current Outpatient Medications  Medication Sig Dispense Refill   metoprolol succinate (TOPROL-XL) 50 MG 24 hr tablet TAKE 1 TABLET (50 MG TOTAL) BY MOUTH DAILY. NEED OV. 30 tablet 1   No current facility-administered medications for this visit.    Allergies:   Patient has no known allergies.    Social History:  The patient  reports that he has never smoked. He has never used smokeless tobacco. He reports that he does not drink alcohol and does not use drugs.   Family History:  The patient's family history includes Healthy in his mother; Heart disease in his father, paternal grandfather, and paternal grandmother; Hernia in his brother; Hyperlipidemia in his father.    ROS: All other systems are reviewed and negative. Unless otherwise mentioned in H&P    PHYSICAL EXAM: VS:  BP 122/88 (BP Location: Right Arm, Patient Position: Sitting, Cuff Size: Normal)    Pulse 73   Ht 5\' 10"  (1.778 m)   Wt 276 lb 9.6 oz (125.5 kg)   BMI 39.69 kg/m  , BMI Body mass index is 39.69 kg/m. GENERAL:  Well appearing WM in NAD HEENT:  PERRL, EOMI, sclera are clear. Oropharynx is clear. NECK:  No jugular venous distention, carotid upstroke brisk and symmetric, no bruits, no thyromegaly or adenopathy LUNGS:  Clear to auscultation bilaterally CHEST:  Unremarkable HEART:  RRR,  PMI not displaced or sustained,S1 and S2 within normal limits, no S3, no S4: no clicks, no rubs, no murmurs ABD:  Soft, nontender. BS +, no masses or bruits. No hepatomegaly, no splenomegaly EXT:  2 + pulses throughout, no edema, no cyanosis no clubbing SKIN:  Warm and dry.  No rashes NEURO:  Alert and oriented x 3. Cranial nerves II through XII intact. PSYCH:  Cognitively intact       EKG: Is done today.NSR rate 73. LAD. I have personally reviewed and interpreted this study.   Recent Labs: No results found for requested labs within last 8760 hours.    Lipid Panel    Component Value Date/Time   CHOL 146 05/18/2017 0857   TRIG 110 05/18/2017 0857   HDL 33 (L) 05/18/2017 0857   CHOLHDL 4.4 05/18/2017 0857   CHOLHDL 4.1 Ratio 07/26/2008 0017   VLDL 26 07/26/2008 0017  LDLCALC 91 05/18/2017 0857      Wt Readings from Last 3 Encounters:  07/23/20 276 lb 9.6 oz (125.5 kg)  12/09/18 242 lb (109.8 kg)  10/13/17 235 lb (106.6 kg)      Other studies Reviewed: ETT 05/25/17: Study Highlights     Blood pressure demonstrated a normal response to exercise. There was no ST segment deviation noted during stress.   ETT with excellent exercise tolerance (12:00); no chest pain; normal BP response; no ST changes; negative adequate ETT; Duke treadmill score 12.   Echo 06/02/17: Study Conclusions   - Left ventricle: The cavity size was normal. Wall thickness was   normal. Systolic function was low normal to mildly reduced. The   estimated ejection fraction was in the range of 50%  to 55%. Wall   motion was normal; there were no regional wall motion   abnormalities. Indeterminant diastolic function. - Aortic valve: There was no stenosis. - Mitral valve: There was no regurgitation. - Right ventricle: The cavity size was mildly dilated. Systolic   function was mildly reduced. - Pulmonary arteries: No complete TR doppler jet so unable to   estimate PA systolic pressure. - Inferior vena cava: The vessel was normal in size. The   respirophasic diameter changes were in the normal range (>= 50%),   consistent with normal central venous pressure.   Impressions:   - Normal LV size with low normal to mildly reduced systolic   function, EF 50-55%. No discrete wall motion abnormalities.   Mildly dilated RV with mildly decreased systolic function.    Event monitor 06/02/17: Frequent isolated PVCs.   Cardiac MRI 07/07/17: IMPRESSION: 1. Normal left ventricular size with basal septal hypertrophy and low normal systolic function (LVEF = 55%). There are no regional wall motion abnormalities.   There is no late gadolinium enhancement in the left ventricular myocardium.   2. Normal right ventricular size when calculated for BSA and normal systolic function (LVEF = 48%). There are no regional wall motion abnormalities, no right ventricular dyskinesis or aneurysms were seen.   There is no evidence for infiltrative, inflammatory cardiomyopathy, neither for arrhythmogenic right ventricular cardiomyopathy.  ASSESSMENT AND PLAN:  1.  PVCs with associated palpitations. Extensive cardiac evaluation is negative.  Normal LV function by Echo. No evidence of ischemia on ETT and no increase in ectopy with exercise. MRI normal without evidence of LV or RV dysfunction. Symptoms improved onToprol XL 50 mg daily- refilled today. Follow up in one year.    2.  Obesity: he has lost significant weight in the past on a plant-based diet. Encouraged him to get back with this diet and lose weight.  He is active.   Continue lifestyle modification.    Marland KitchenPeter Swaziland MD, [facility]

## 2020-07-23 ENCOUNTER — Ambulatory Visit: Payer: Managed Care, Other (non HMO) | Admitting: Cardiology

## 2020-07-23 ENCOUNTER — Other Ambulatory Visit: Payer: Self-pay

## 2020-07-23 ENCOUNTER — Encounter: Payer: Self-pay | Admitting: Cardiology

## 2020-07-23 VITALS — BP 122/88 | HR 73 | Ht 70.0 in | Wt 276.6 lb

## 2020-07-23 DIAGNOSIS — I493 Ventricular premature depolarization: Secondary | ICD-10-CM

## 2020-07-23 MED ORDER — METOPROLOL SUCCINATE ER 50 MG PO TB24: 50.0000 mg | ORAL_TABLET | Freq: Every day | ORAL | 3 refills | Status: DC

## 2021-07-08 ENCOUNTER — Encounter: Payer: Self-pay | Admitting: *Deleted

## 2021-07-25 ENCOUNTER — Other Ambulatory Visit: Payer: Self-pay | Admitting: Cardiology

## 2021-08-25 ENCOUNTER — Other Ambulatory Visit: Payer: Self-pay | Admitting: Cardiology

## 2021-09-18 ENCOUNTER — Other Ambulatory Visit: Payer: Self-pay | Admitting: Cardiology

## 2021-09-23 NOTE — Progress Notes (Unsigned)
Cardiology Office Note   Date:  09/25/2021   ID:  Emma Schupp, DOB [birth year], MRN [id number]  PCP:  Nestor Ramp, MD  Cardiologist: Matheau Orona [place] MD Chief Complaint  Patient presents with   Palpitations      History of Present Illness: Mcihael Hinderman is a 51 y.o. male who is seen for follow up  of  palpitations.  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 Needs refill of Toprol. He has no prior cardiac history.  His father, Nichola Warren, is a patient of mine as well, who is followed for coronary artery disease with history of CABG.  He was seen in 2019 with complaints of palpitations. Holter demonstrated PVCs. ETT, Echo and cardiac MRI were OK. Symptoms improved on Toprol XL.   Plans to retire next year from the [employer]. Has rare palpitations.     History reviewed. No pertinent past medical history.  Past Surgical History:  Procedure Laterality Date   TONSILLECTOMY AND ADENOIDECTOMY       Current Outpatient Medications  Medication Sig Dispense Refill   metoprolol succinate (TOPROL-XL) 50 MG 24 hr tablet TAKE 1 TABLET BY MOUTH DAILY. MUST SCHEDULE OFFICE VISIT. 15 tablet 0   No current facility-administered medications for this visit.    Allergies:   Patient has no known allergies.    Social History:  The patient  reports that he has never smoked. He has never used smokeless tobacco. He reports that he does not drink alcohol and does not use drugs.   Family History:  The patient's family history includes Healthy in his mother; Heart disease in his father, paternal grandfather, and paternal grandmother; Hernia in his brother; Hyperlipidemia in his father.    ROS: All other systems are reviewed and negative. Unless otherwise mentioned in H&P    PHYSICAL EXAM: VS:  BP 136/86   Pulse 78   Ht 5\' 10"  (1.778 m)   Wt 285 lb 3.2 oz (129.4 kg)   SpO2 96%   BMI 40.92 kg/m  , BMI Body mass index is 40.92 kg/m. GENERAL:  Well appearing WM in NAD HEENT:  PERRL, EOMI, sclera are  clear. Oropharynx is clear. NECK:  No jugular venous distention, carotid upstroke brisk and symmetric, no bruits, no thyromegaly or adenopathy LUNGS:  Clear to auscultation bilaterally CHEST:  Unremarkable HEART:  RRR,  PMI not displaced or sustained,S1 and S2 within normal limits, no S3, no S4: no clicks, no rubs, no murmurs ABD:  Soft, nontender. BS +, no masses or bruits. No hepatomegaly, no splenomegaly EXT:  2 + pulses throughout, no edema, no cyanosis no clubbing SKIN:  Warm and dry.  No rashes NEURO:  Alert and oriented x 3. Cranial nerves II through XII intact. PSYCH:  Cognitively intact       EKG: Is done today.NSR rate 78. LAFB. No change. I have personally reviewed and interpreted this study.   Recent Labs: No results found for requested labs within last 365 days.    Lipid Panel    Component Value Date/Time   CHOL 146 05/18/2017 0857   TRIG 110 05/18/2017 0857   HDL 33 (L) 05/18/2017 0857   CHOLHDL 4.4 05/18/2017 0857   CHOLHDL 4.1 Ratio 07/26/2008 0017   VLDL 26 07/26/2008 0017   LDLCALC 91 05/18/2017 0857      Wt Readings from Last 3 Encounters:  09/25/21 285 lb 3.2 oz (129.4 kg)  07/23/20 276 lb 9.6 oz (125.5 kg)  12/09/18 242 lb (  109.8 kg)      Other studies Reviewed: ETT 05/25/17: Study Highlights     Blood pressure demonstrated a normal response to exercise. There was no ST segment deviation noted during stress.   ETT with excellent exercise tolerance (12:00); no chest pain; normal BP response; no ST changes; negative adequate ETT; Duke treadmill score 12.   Echo 06/02/17: Study Conclusions   - Left ventricle: The cavity size was normal. Wall thickness was   normal. Systolic function was low normal to mildly reduced. The   estimated ejection fraction was in the range of 50% to 55%. Wall   motion was normal; there were no regional wall motion   abnormalities. Indeterminant diastolic function. - Aortic valve: There was no stenosis. - Mitral  valve: There was no regurgitation. - Right ventricle: The cavity size was mildly dilated. Systolic   function was mildly reduced. - Pulmonary arteries: No complete TR doppler jet so unable to   estimate PA systolic pressure. - Inferior vena cava: The vessel was normal in size. The   respirophasic diameter changes were in the normal range (>= 50%),   consistent with normal central venous pressure.   Impressions:   - Normal LV size with low normal to mildly reduced systolic   function, EF 50-55%. No discrete wall motion abnormalities.   Mildly dilated RV with mildly decreased systolic function.    Event monitor 06/02/17: Frequent isolated PVCs.   Cardiac MRI 07/07/17: IMPRESSION: 1. Normal left ventricular size with basal septal hypertrophy and low normal systolic function (LVEF = 55%). There are no regional wall motion abnormalities.   There is no late gadolinium enhancement in the left ventricular myocardium.   2. Normal right ventricular size when calculated for BSA and normal systolic function (LVEF = 48%). There are no regional wall motion abnormalities, no right ventricular dyskinesis or aneurysms were seen.   There is no evidence for infiltrative, inflammatory cardiomyopathy, neither for arrhythmogenic right ventricular cardiomyopathy.  ASSESSMENT AND PLAN:  1.  PVCs with associated palpitations. Extensive cardiac evaluation is negative.  Normal LV function by Echo. No evidence of ischemia on ETT and no increase in ectopy with exercise. MRI normal without evidence of LV or RV dysfunction. Symptoms improved onToprol XL 50 mg daily- refilled today. Follow up in 1-2 years  2.  Obesity: he has gained 8 lbs in a year.  Encouraged him to get back with this diet and lose weight. He is active.   Continue lifestyle modification.    Marland KitchenPeter Swaziland MD, [facility]

## 2021-09-25 ENCOUNTER — Ambulatory Visit: Payer: Managed Care, Other (non HMO) | Admitting: Cardiology

## 2021-09-25 ENCOUNTER — Encounter: Payer: Self-pay | Admitting: Cardiology

## 2021-09-25 VITALS — BP 136/86 | HR 78 | Ht 70.0 in | Wt 285.2 lb

## 2021-09-25 DIAGNOSIS — I493 Ventricular premature depolarization: Secondary | ICD-10-CM

## 2021-09-25 MED ORDER — METOPROLOL SUCCINATE ER 50 MG PO TB24
50.0000 mg | ORAL_TABLET | Freq: Every day | ORAL | 3 refills | Status: DC
Start: 2021-09-25 — End: 2022-10-06

## 2022-09-19 NOTE — Progress Notes (Unsigned)
Cardiology Office Note:    Date:  09/21/2022   ID:  Philip Waters, DOB 1970/05/24, MRN [id number]  PCP:  Nestor Ramp, MD  Cardiologist:  Peter Sw[place], MD  Electrophysiologist:  None   Referring MD: Nestor Ramp, MD   Chief Complaint: follow-up of palpitations  History of Present Illness:    Philip Waters is a 52 y.o. male with a history of palpitations with PVCs noted on monitor in 2019 and obesity who is followed by Dr. Sw[place]iland and presents today for routine follow-up.  Patient was referred to Cardiology in 05/2017 for palpitations. Monitor showed frequent PVCs. ETT and Echo were also ordered at that time.  ETT showed no EKG changes suggestive of ischemia.  Echo showed LVEF of 50-55% with no regional wall motion abnormalities and mildly dilated RV with mildly reduced systolic function. Cardiac MRI was then ordered for further evaluation of RV enlargement and to rule out ARVC and showed normal LV and RV size and function with no late gadolinium enhancement. There was no evidence for infiltrative or inflammatory cardiomyopathy nor arrhythmogenic right ventricular cardiomyopathy. He was ultimately started on Toprol-XL.  Patient was last seen by Dr. Sw[place]and in 09/2021 at which time he reported rare palpitations and was doing well from a cardiac standpoint.   Patient presents today for follow-up. He is doing very well from a cardiac standpoint. He reports occasionally being able to feel his PVCs if he is stressed or when he lays down at night but no significant/ prolonged palpitations/ heart racing. No chest pain, shortness of breath, orthopnea, PND, edema, lightheadedness, dizziness, or syncope. His BP is well controlled today and he states he routinely gets his cholesterol checked through his work and it has looked good. He has lost almost 20 lbs in the past 2 months and is staying active and working on his diet. 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   EKGs/Labs/Other  Studies Reviewed:    The following studies were reviewed:  ETT 05/25/2017: Blood pressure demonstrated a normal response to exercise. There was no ST segment deviation noted during stress.   ETT with excellent exercise tolerance (12:00); no chest pain; normal BP response; no ST changes; negative adequate ETT; Duke treadmill score 12. _______________  Echocardiogram 06/02/2017: Study Conclusions: - Left ventricle: The cavity size was normal. Wall thickness was    normal. Systolic function was low normal to mildly reduced. The    estimated ejection fraction was in the range of 50% to 55%. Wall    motion was normal; there were no regional wall motion    abnormalities. Indeterminant diastolic function.  - Aortic valve: There was no stenosis.  - Mitral valve: There was no regurgitation.  - Right ventricle: The cavity size was mildly dilated. Systolic    function was mildly reduced.  - Pulmonary arteries: No complete TR doppler jet so unable to    estimate PA systolic pressure.  - Inferior vena cava: The vessel was normal in size. The    respirophasic diameter changes were in the normal range (>= 50%),    consistent with normal central venous pressure.  _______________  Monitor 06/04/2017 to 06/17/2017: Normal sinus rhythm and sinus tachycardia PVCs Symptoms of fluttering and skipping correlate with PVCs _______________  Cardiac MRI 07/07/2017: Impressions: 1. Normal left ventricular size with basal septal hypertrophy and low normal systolic function (LVEF = 55%). There are no regional wall motion abnormalities.   There is no late  gadolinium enhancement in the left ventricular myocardium.   2. Normal right ventricular size when calculated for BSA and normal systolic function (LVEF = 48%). There are no regional wall motion abnormalities, no right ventricular dyskinesis or aneurysms were seen.   There is no evidence for infiltrative, inflammatory cardiomyopathy, neither for  arrhythmogenic right ventricular cardiomyopathy.    EKG:  EKG ordered today.   EKG Interpretation Date/Time:  Monday September 21 2022 13:36:05 EDT Ventricular Rate:  61 PR Interval:  178 QRS Duration:  112 QT Interval:  420 QTC Calculation: 422 R Axis:   -46  Text Interpretation: Normal sinus rhythm with sinus arrhythmia Left anterior fascicular block Non-specific T wave changes No acute changes compared to prior tracings Confirmed by Marjie Skiff [phone]) on 09/21/2022 1:42:55 PM    Recent Labs: No results found for requested labs within last 365 days.  Recent Lipid Panel    Component Value Date/Time   CHOL 146 05/18/2017 0857   TRIG 110 05/18/2017 0857   HDL 33 (L) 05/18/2017 0857   CHOLHDL 4.4 05/18/2017 0857   CHOLHDL 4.1 Ratio 07/26/2008 0017   VLDL 26 07/26/2008 0017   LDLCALC 91 05/18/2017 0857    Physical Exam:    Vital Signs: BP 109/66   Pulse 61   Ht 5\' 10"  (1.778 m)   Wt 267 lb (121.1 kg)   SpO2 96%   BMI 38.31 kg/m     Wt Readings from Last 3 Encounters:  09/21/22 267 lb (121.1 kg)  09/25/21 285 lb 3.2 oz (129.4 kg)  07/23/20 276 lb 9.6 oz (125.5 kg)     General: 52 y.o. obese Caucasian male in no acute distress. HEENT: Normocephalic and atraumatic. Sclera clear. Neck: Supple. No carotid bruits. No JVD. Heart: RRR. Distinct S1 and S2. No murmurs, gallops, or rubs.  Lungs: No increased work of breathing. Clear to ausculation bilaterally. No wheezes, rhonchi, or rales.  Abdomen: Soft, non-distended, and non-tender to palpation.  Extremities: No lower extremity edema.    Skin: Warm and dry. Neuro: Alert and oriented x3. No focal deficits. Psych: Normal affect. Responds appropriately.  Assessment:    1. Palpitations   2. PVC's (premature ventricular contractions)   3. Obesity (BMI 30-39.9)     Plan:    Palpitations Frequent PVCs Patient has a history of palpitations with frequent PVCs noted on monitor in 2019. ETT, Echo, and cardiac MRI  were all unremarkable. Palpitations have been well controlled on a beta-blocker.  - Well controlled.  - Continue Toprol-XL 50mg  daily.   Obesity BMI 38. He is staying active and has been working on his diet the past 4 months. He states he has lost 19 lbs in the past 20 lbs. Congratulated patient on this and encouraged him to keep it up.  Disposition: Follow up in 1 year.    Medication Adjustments/Labs and Tests Ordered: Current medicines are reviewed at length with the patient today.  Concerns regarding medicines are outlined above.  Orders Placed This Encounter  Procedures   EKG 12-Lead   No orders of the defined types were placed in this encounter.   Patient Instructions  Medication Instructions:  The current medical regimen is effective;  continue present plan and medications as directed. Please refer to the Current Medication list given to you today.  *If you need a refill on your cardiac medications before your next appointment, please call your pharmacy*  Lab Work: NONE If you have labs (blood work) drawn today and your tests are  completely normal, you will receive your results only by:   MyChart Message (if you have MyChart) OR  A paper copy in the mail If you have any lab test that is abnormal or we need to change your treatment, we will call you to review the results.  Testing/Procedures: NONE  Follow-Up: At [facility], you and your health needs are our priority.  As part of our continuing mission to provide you with exceptional heart care, we have created designated Provider Care Teams.  These Care Teams include your primary Cardiologist (physician) and Advanced Practice Providers (APPs -  Physician Assistants and Nurse Practitioners) who all work together to provide you with the care you need, when you need it.  Your next appointment:   12 month(s)  Provider:   Peter Sw[place]iland, MD  or Marjie Skiff, PA-C        Other Instructions    Signed, Corrin Parker, PA-C  09/21/2022 2:06 PM    [facility]

## 2022-09-21 ENCOUNTER — Encounter: Payer: Self-pay | Admitting: Student

## 2022-09-21 ENCOUNTER — Ambulatory Visit: Payer: Managed Care, Other (non HMO) | Attending: Student | Admitting: Student

## 2022-09-21 VITALS — BP 109/66 | HR 61 | Ht 70.0 in | Wt 267.0 lb

## 2022-09-21 DIAGNOSIS — R002 Palpitations: Secondary | ICD-10-CM | POA: Diagnosis not present

## 2022-09-21 DIAGNOSIS — E669 Obesity, unspecified: Secondary | ICD-10-CM

## 2022-09-21 DIAGNOSIS — I493 Ventricular premature depolarization: Secondary | ICD-10-CM

## 2022-09-21 NOTE — Patient Instructions (Signed)
Medication Instructions:  The current medical regimen is effective;  continue present plan and medications as directed. Please refer to the Current Medication list given to you today.  *If you need a refill on your cardiac medications before your next appointment, please call your pharmacy*  Lab Work: NONE If you have labs (blood work) drawn today and your tests are completely normal, you will receive your results only by:   MyChart Message (if you have MyChart) OR  A paper copy in the mail If you have any lab test that is abnormal or we need to change your treatment, we will call you to review the results.  Testing/Procedures: NONE  Follow-Up: At [facility], you and your health needs are our priority.  As part of our continuing mission to provide you with exceptional heart care, we have created designated Provider Care Teams.  These Care Teams include your primary Cardiologist (physician) and Advanced Practice Providers (APPs -  Physician Assistants and Nurse Practitioners) who all work together to provide you with the care you need, when you need it.  Your next appointment:   12 month(s)  Provider:   Peter Sw[place]il[place], MD  or Marjie Skiff, PA-C        Other Instructions

## 2022-10-04 ENCOUNTER — Other Ambulatory Visit: Payer: Self-pay | Admitting: Cardiology

## 2022-12-08 ENCOUNTER — Encounter: Payer: Self-pay | Admitting: Family Medicine

## 2022-12-08 ENCOUNTER — Other Ambulatory Visit: Payer: Self-pay

## 2022-12-08 ENCOUNTER — Ambulatory Visit: Payer: Managed Care, Other (non HMO) | Admitting: Family Medicine

## 2022-12-08 VITALS — BP 124/86 | Ht 70.0 in | Wt 265.0 lb

## 2022-12-08 DIAGNOSIS — M79604 Pain in right leg: Secondary | ICD-10-CM

## 2022-12-08 DIAGNOSIS — M5431 Sciatica, right side: Secondary | ICD-10-CM | POA: Insufficient documentation

## 2022-12-08 MED ORDER — PREDNISONE 10 MG PO TABS: ORAL_TABLET | ORAL | 0 refills | Status: AC

## 2022-12-08 NOTE — Progress Notes (Signed)
Rece Zechman - 52 y.o. male MRN [id number]  Date of birth: [birth year]  PCP: Nestor Ramp, MD  Subjective:  No chief complaint on file. Right leg pain  HPI: Past Medical, Surgical, Social, and Family History Reviewed & Updated per EMR.   Patient is a 52 y.o. male here for pain located in the posterior aspect of his right lower extremity for 3 weeks that radiates down the back of the right leg sometimes to the foot.  Pain is worsened with sitting for prolonged periods of time but not by forward bending or going up and down stairs.  He has not tried any therapies thus far.  He denies any weakness or numbness of the lower extremity and has not had any falls or trauma to the area.  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  He does not do any regular stretching.  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  Pertinent PMHx: High PVC load on metoprolol  Past Surgical History:  Procedure Laterality Date   TONSILLECTOMY AND ADENOIDECTOMY      No Known Allergies      Objective:  Physical Exam: VS: BP:124/86  HR: bpm  TEMP: ( )  RESP:   HT:5\' 10"  (177.8 cm)   WT:265 lb (120.2 kg)  BMI:38.02  Gen: NAD, speaks clearly, comfortable in exam room Respiratory: normal work of breathing on room air Skin: No rashes, abrasions, or ecchymosis MSK:  R Hip: Gait normal Leg length: equal at the malleoli  ROM: full w/out apin Strength Flexion: 5/5, Extension: 5/5, Abduction: 3/5, Adduction: 5/5 Trendelenburg sign positive bilaterally Greater trochanter non tender Ober's neg Tenderness over piriformis and origin of the hamstrings, exacerbated with palpation and slightly with flexion at the knee and internal rotation of the hip Posterior Hip: FABER negative for SI pain, Piriformis positive FADIR negative for anterior hip pain, log roll negative  DTRs 2+ at the patella and Achilles  and equal bilaterally. No sensory deficits  Limited ultrasound of the right piriformis:  The semimembranosus/semitendinosus/biceps femoris muscular bundles were followed up to the origin point.  No insertional abnormalities The piriformis was visualized in LAX and SAX. An area of anechoic fluid visualized deep to the piriformis muscle and SAX with the sciatic nerve deep to this. Dynamic IR/ER of the hip shows fluctuation of the fluid pocket Visualized in LAX the sciatic nerve is surrounded by hypoechoic fluid  Summary: Findings show inflammation and fluid surrounding the sciatic nerve deep to the piriformis muscle.  Ultrasound and interpretation by Dr. Webb Silversmith and Dr. Christella Hartigan   Assessment & Plan:   Sciatica of right side without back pain - Shabazz's history and physical exam are consistent with true sciatica, likely secondary to compensatory tightness of the piriformis muscle with weakness of the hip abductors. -Ultrasound findings show inflammation and fluid surrounding the sciatic nerve deep to the piriformis muscle. -Given the amount of fluid surrounding the nerve we will start a steroid Dosepak.  The side effects, risks and benefits were discussed with the patient. - Piriformis stretches given to do at home along with hip abductor strengthening exercises. - We discussed the use of directed ice therapy as well - We will follow-up in 4 weeks to assess progress.    Rica Mote MD [facility] Fellow  Addendum:  Patient seen and examined in the office  with the fellow.  History, exam, plan of care were precepted with me.  I was present for MSK U/S and assisted with interpretation.  Agree with findings as noted in fellow note.  Darene Lamer, DO, CAQSM

## 2022-12-08 NOTE — Assessment & Plan Note (Signed)
-   Jakyrie's history and physical exam are consistent with true sciatica, likely secondary to compensatory tightness of the piriformis muscle with weakness of the hip abductors. -Ultrasound findings show inflammation and fluid surrounding the sciatic nerve deep to the piriformis muscle. -Given the amount of fluid surrounding the nerve we will start a steroid Dosepak.  The side effects, risks and benefits were discussed with the patient. - Piriformis stretches given to do at home along with hip abductor strengthening exercises. - We discussed the use of directed ice therapy as well - We will follow-up in 4 weeks to assess progress.

## 2023-01-05 ENCOUNTER — Ambulatory Visit: Payer: Managed Care, Other (non HMO) | Admitting: Family Medicine

## 2023-01-05 ENCOUNTER — Encounter: Payer: Self-pay | Admitting: Family Medicine

## 2023-01-05 VITALS — BP 116/82 | Ht 70.0 in | Wt 265.0 lb

## 2023-01-05 DIAGNOSIS — M5431 Sciatica, right side: Secondary | ICD-10-CM | POA: Diagnosis not present

## 2023-01-05 MED ORDER — MELOXICAM 15 MG PO TABS: 15.0000 mg | ORAL_TABLET | Freq: Every day | ORAL | 0 refills | Status: AC

## 2023-01-05 NOTE — Assessment & Plan Note (Signed)
-   Rhody has improved about 10% since the oral steroids and home stretches. He will be retiring in 5 days and will have a lifestyle change amenable to healing. We discussed modification of the side leg raises to target the hip abductors rather than quadriceps.  - After discussing tx options including injection, he would like to try formal PT with daily Meloxicam first which is reasonable.  - PT referral sent - I will follow up in 4-6 weeks to reevaluate or sooner if needed.

## 2023-01-05 NOTE — Progress Notes (Signed)
  Philip Waters - 52 y.o. male MRN [id number]  Date of birth: Mar 06, 1970  PCP: Nestor Ramp, MD  Subjective:  No chief complaint on file.  Right sciatica  HPI: Past Medical, Surgical, Social, and Family History Reviewed & Updated per EMR.   Patient is a 52 y.o. male here for follow up on R sided sciatica 2/2 piriformis syndrome, last seen on 12/08/2022. The patient has tried the oral steroid pack and home stretches/strengthening of the hip abductors which has helped some. He is still having the aching pain but not as frequent and notes some increased ROM. He is retiring in 5 days so he has stopped wearing his side arm when seated on the job which has helped some as well. There has been no new numbness or weakness of the lower extremity.    History reviewed. No pertinent past medical history.  Current Outpatient Medications on File Prior to Visit  Medication Sig Dispense Refill   metoprolol succinate (TOPROL-XL) 50 MG 24 hr tablet TAKE 1 TABLET BY MOUTH DAILY. TAKE WITH OR IMMEDIATELY FOLLOWING A MEAL. 90 tablet 3   predniSONE (DELTASONE) 10 MG tablet Use as directed per doctors orders for the next 6 days. 21 tablet 0   No current facility-administered medications on file prior to visit.    Past Surgical History:  Procedure Laterality Date   TONSILLECTOMY AND ADENOIDECTOMY      No Known Allergies      Objective:  Physical Exam: VS: BP:116/82  HR: bpm  TEMP: ( )  RESP:   HT:5\' 10"  (177.8 cm)   WT:265 lb (120.2 kg)  BMI:38.02  Gen: NAD, speaks clearly, comfortable in exam room Respiratory: Normal respiratory effort on room air. No signs of distress Skin: No rashes, abrasions, or ecchymosis MSK:  Normal gate The R hip has full ROM in flexion and extension IR to 25 degrees and ER to 45 degrees and some mild discomfort elicited with both Flexion and adduction strength 5/5 Hip abduction 3/5 strength Piriformis test mildly positive Full ROM and strength at the knee No sensory  deficits distally   Assessment & Plan:   Sciatica of right side without back pain - Alford has improved about 10% since the oral steroids and home stretches. He will be retiring in 5 days and will have a lifestyle change amenable to healing. We discussed modification of the side leg raises to target the hip abductors rather than quadriceps.  - After discussing tx options including injection, he would like to try formal PT with daily Meloxicam first which is reasonable.  - PT referral sent - I will follow up in 4-6 weeks to reevaluate or sooner if needed.     Rica Mote MD [facility] Fellow  Addendum:  Patient seen and examined in the office with fellow.   History, exam, plan of care were precepted with me.  Agree with findings as documented in fellow note above.  Darene Lamer, DO, CAQSM

## 2023-02-09 ENCOUNTER — Ambulatory Visit: Payer: Managed Care, Other (non HMO) | Admitting: Family Medicine

## 2023-10-06 ENCOUNTER — Other Ambulatory Visit: Payer: Self-pay | Admitting: Cardiology

## 2024-01-06 ENCOUNTER — Other Ambulatory Visit: Payer: Self-pay | Admitting: Cardiology

## 2024-02-02 ENCOUNTER — Other Ambulatory Visit: Payer: Self-pay | Admitting: Cardiology

## 2024-02-06 ENCOUNTER — Other Ambulatory Visit: Payer: Self-pay | Admitting: Cardiology

## 2024-02-21 ENCOUNTER — Other Ambulatory Visit: Payer: Self-pay | Admitting: Cardiology
